# Patient Record
Sex: Male | Born: 1945 | Race: White | Hispanic: No | Smoking: Never smoker
Health system: Southern US, Community
[De-identification: ages and names within clinical notes are randomized; demographics above are authoritative.]

---

## 2017-11-23 ENCOUNTER — Emergency Department (HOSPITAL_COMMUNITY): Payer: Medicare Other

## 2017-11-23 ENCOUNTER — Encounter (HOSPITAL_COMMUNITY): Payer: Self-pay | Admitting: Emergency Medicine

## 2017-11-23 ENCOUNTER — Emergency Department (HOSPITAL_COMMUNITY)
Admission: EM | Admit: 2017-11-23 | Discharge: 2017-11-23 | Disposition: A | Discharge status: Home / Self Care | Payer: Medicare Other | Attending: Emergency Medicine | Admitting: Emergency Medicine

## 2017-11-23 DIAGNOSIS — S299XXA Unspecified injury of thorax, initial encounter: Secondary | ICD-10-CM | POA: Insufficient documentation

## 2017-11-23 DIAGNOSIS — M25551 Pain in right hip: Secondary | ICD-10-CM | POA: Diagnosis not present

## 2017-11-23 DIAGNOSIS — S0990XA Unspecified injury of head, initial encounter: Secondary | ICD-10-CM | POA: Diagnosis present

## 2017-11-23 DIAGNOSIS — Y999 Unspecified external cause status: Secondary | ICD-10-CM | POA: Diagnosis not present

## 2017-11-23 DIAGNOSIS — W14XXXA Fall from tree, initial encounter: Secondary | ICD-10-CM | POA: Insufficient documentation

## 2017-11-23 DIAGNOSIS — W19XXXA Unspecified fall, initial encounter: Secondary | ICD-10-CM

## 2017-11-23 DIAGNOSIS — M25511 Pain in right shoulder: Secondary | ICD-10-CM | POA: Insufficient documentation

## 2017-11-23 DIAGNOSIS — Y939 Activity, unspecified: Secondary | ICD-10-CM | POA: Insufficient documentation

## 2017-11-23 DIAGNOSIS — Y929 Unspecified place or not applicable: Secondary | ICD-10-CM | POA: Diagnosis not present

## 2017-11-23 DIAGNOSIS — T1490XA Injury, unspecified, initial encounter: Secondary | ICD-10-CM

## 2017-11-23 LAB — BASIC METABOLIC PANEL
ANION GAP: 4 — AB (ref 5–15)
BUN: 15 mg/dL (ref 8–23)
CALCIUM: 9.4 mg/dL (ref 8.9–10.3)
CHLORIDE: 110 mmol/L (ref 98–111)
CO2: 26 mmol/L (ref 22–32)
Creatinine, Ser: 1.49 mg/dL — ABNORMAL HIGH (ref 0.61–1.24)
GFR calc non Af Amer: 45 mL/min — ABNORMAL LOW (ref >60)
GFR, EST AFRICAN AMERICAN: 52 mL/min — AB (ref >60)
Glucose, Bld: 106 mg/dL — ABNORMAL HIGH (ref 70–99)
Potassium: 3.9 mmol/L (ref 3.5–5.1)
SODIUM: 140 mmol/L (ref 135–145)

## 2017-11-23 LAB — I-STAT CHEM 8, ED
BUN: 18 mg/dL (ref 8–23)
Calcium, Ion: 1.22 mmol/L (ref 1.15–1.40)
Chloride: 106 mmol/L (ref 98–111)
Creatinine, Ser: 1.6 mg/dL — ABNORMAL HIGH (ref 0.61–1.24)
GLUCOSE: 102 mg/dL — AB (ref 70–99)
HCT: 39 % (ref 39.0–52.0)
HEMOGLOBIN: 13.3 g/dL (ref 13.0–17.0)
POTASSIUM: 4.1 mmol/L (ref 3.5–5.1)
SODIUM: 141 mmol/L (ref 135–145)
TCO2: 26 mmol/L (ref 22–32)

## 2017-11-23 LAB — CBC
HCT: 39.6 % (ref 39.0–52.0)
HEMOGLOBIN: 12.9 g/dL — AB (ref 13.0–17.0)
MCH: 29.2 pg (ref 26.0–34.0)
MCHC: 32.6 g/dL (ref 30.0–36.0)
MCV: 89.6 fL (ref 80.0–100.0)
NRBC: 0 % (ref 0.0–0.2)
Platelets: 310 10*3/uL (ref 150–400)
RBC: 4.42 MIL/uL (ref 4.22–5.81)
RDW: 12.3 % (ref 11.5–15.5)
WBC: 10.6 10*3/uL — AB (ref 4.0–10.5)

## 2017-11-23 LAB — URINALYSIS, ROUTINE W REFLEX MICROSCOPIC
Bilirubin Urine: NEGATIVE
Glucose, UA: NEGATIVE mg/dL
Hgb urine dipstick: NEGATIVE
KETONES UR: 5 mg/dL — AB
Leukocytes, UA: NEGATIVE
NITRITE: NEGATIVE
Protein, ur: NEGATIVE mg/dL
Specific Gravity, Urine: 1.031 — ABNORMAL HIGH (ref 1.005–1.030)
pH: 6 (ref 5.0–8.0)

## 2017-11-23 MED ORDER — SODIUM CHLORIDE 0.9 % IV BOLUS
1000.0000 mL | Freq: Once | INTRAVENOUS | Status: AC
  Administered 2017-11-23: 1000 mL via INTRAVENOUS

## 2017-11-23 MED ORDER — IOHEXOL 300 MG/ML  SOLN
80.0000 mL | Freq: Once | INTRAMUSCULAR | Status: AC | PRN
  Administered 2017-11-23: 80 mL via INTRAVENOUS

## 2017-11-23 MED ORDER — HYDROCODONE-ACETAMINOPHEN 5-325 MG PO TABS: 1.0000 | ORAL_TABLET | Freq: Four times a day (QID) | ORAL | 0 refills | Status: AC | PRN

## 2017-11-23 MED ORDER — CAMPHOR-MENTHOL-METHYL SAL 1.2-5.7-6.3 % EX PTCH: 1.0000 | MEDICATED_PATCH | Freq: Every day | CUTANEOUS | 0 refills | Status: AC

## 2017-11-23 MED ORDER — LIDOCAINE 5 % EX PTCH
1.0000 | MEDICATED_PATCH | CUTANEOUS | Status: DC
  Administered 2017-11-23: 1 via TRANSDERMAL
  Filled 2017-11-23: qty 1

## 2017-11-23 NOTE — ED Triage Notes (Signed)
Pt to ER for evaluation of right hip pain and shoulder pain after falling out of his deer stand last night. Pt reports pain with walking. No midline cervical or spinal tenderness.

## 2017-11-23 NOTE — ED Notes (Signed)
Patient transported to CT 

## 2017-11-23 NOTE — ED Provider Notes (Signed)
[facility] EMERGENCY DEPARTMENT Provider Note   CSN: [id number] Arrival date & time: 11/23/17  1143     History   Chief Complaint Chief Complaint  Patient presents with  . Hip Pain  . Shoulder Pain    HPI Robert Flowers is a 72 y.o. male.  HPI  Patient is a 72 year old male with a history of hypertension and on antiplatelet therapy of 81 mg of aspirin daily presenting for fall.  Patient reports the fall occurred approximately 7 PM yesterday evening.  Patient reports he fell approximately 15 feet from a deer hunting scaffold onto his lower back and right hip.  He reports that he did hit his head in this process.  Patient denies loss of consciousness, but does report getting "the wind knocked out of him".  Patient reports he has had progressive difficulty with ambulation on the right hip, particularly in the posterior aspect.  He also reports right shoulder pain but is able to perform range of motion with this.  Patient denies abdominal tenderness, bruising, hematuria.  Patient denies visual disturbance, weakness or numbness in lower extremities, vomiting or retrograde amnesia.  He reports he was able to ambulate with antalgic gait to his car and drive home after the incident.  History reviewed. No pertinent past medical history.  There are no active problems to display for this patient.   History reviewed. No pertinent surgical history.      Home Medications    Prior to Admission medications   Not on File    Family History No family history on file.  Social History Social History   Tobacco Use  . Smoking status: Never Smoker  . Smokeless tobacco: Never Used  Substance Use Topics  . Alcohol use: Yes  . Drug use: Not on file     Allergies   Patient has no allergy information on record.   Review of Systems Review of Systems  Constitutional: Negative for chills and fever.  HENT: Negative for congestion and sore throat.   Eyes: Negative for  visual disturbance.  Respiratory: Negative for cough, chest tightness and shortness of breath.   Cardiovascular: Negative for chest pain and leg swelling.  Gastrointestinal: Negative for abdominal pain, nausea and vomiting.  Genitourinary: Negative for dysuria and flank pain.  Musculoskeletal: Positive for arthralgias, back pain and gait problem. Negative for joint swelling and myalgias.  Skin: Negative for rash.  Neurological: Negative for dizziness, syncope, light-headedness and headaches.     Physical Exam Updated Vital Signs BP (!) 158/90 (BP Location: Right Arm)   Pulse 82   Temp 98.9 F (37.2 C) (Oral)   Resp 16   SpO2 98%   Physical Exam  Constitutional: He appears well-developed and well-nourished. No distress.  HENT:  Head: Normocephalic and atraumatic.  Mouth/Throat: Oropharynx is clear and moist.  Eyes: Pupils are equal, round, and reactive to light. Conjunctivae and EOM are normal.  Neck: Normal range of motion. Neck supple.  Cardiovascular: Normal rate, regular rhythm, S1 normal and S2 normal.  No murmur heard. Pulmonary/Chest: Effort normal and breath sounds normal. He has no wheezes. He has no rales.  Patient has right lateral chest wall tenderness in the right lower ribs.  Abdominal: Soft. He exhibits no distension. There is tenderness. There is no guarding.  Right-sided suprapubic tenderness to palpation.  No guarding or rebound. No abdominal or flank ecchymosis noted.  Musculoskeletal:  Patient has no midline cervical spine tenderness, but has right-sided paraspinal muscular tenderness palpation. No  midline or paraspinal muscular tenderness of thoracic or lumbar spine. Right shoulder with tenderness of scapular region. Full ROM. Negative empty can test, negative Neer's. No swelling, erythema or ecchymosis present. No step-off, crepitus, or deformity appreciated. 5/5 muscle strength of UE. 2+ radial pulse, sensation intact and all compartments soft.  Patient  has exquisite tenderness to palpation in the right hamstring.  No balled up tissue noted.  Patient still has intact hip flexor and extensor mechanism of the right lower extremity.  Neurological: He is alert.  Cranial nerves grossly intact. Patient moves extremities symmetrically and with good coordination.  Skin: Skin is warm and dry. No rash noted. No erythema.  Psychiatric: He has a normal mood and affect. His behavior is normal. Judgment and thought content normal.  Nursing note and vitals reviewed.    ED Treatments / Results  Labs (all labs ordered are listed, but only abnormal results are displayed) Labs Reviewed  CBC - Abnormal; Notable for the following components:      Result Value   WBC 10.6 (*)    Hemoglobin 12.9 (*)    All other components within normal limits  BASIC METABOLIC PANEL - Abnormal; Notable for the following components:   Glucose, Bld 106 (*)    Creatinine, Ser 1.49 (*)    GFR calc non Af Amer 45 (*)    GFR calc Af Amer 52 (*)    Anion gap 4 (*)    All other components within normal limits  I-STAT CHEM 8, ED - Abnormal; Notable for the following components:   Creatinine, Ser 1.60 (*)    Glucose, Bld 102 (*)    All other components within normal limits    EKG None  Radiology Dg Shoulder Right  Result Date: 11/23/2017 CLINICAL DATA:  Right shoulder pain since a fall out of a deer stand today. Initial encounter. EXAM: RIGHT SHOULDER - 2+ VIEW COMPARISON:  None. FINDINGS: The humerus is located. No acute fracture. Remote right rib fractures are identified. Small well corticated bone fragments about the acromioclavicular joint and partial ossification of the coracoclavicular ligament are consistent with remote injury. IMPRESSION: No acute abnormality. Remote right rib fractures. Findings compatible with remote [facility] joint injury. Electronically Signed   By: Drusilla Kanner M.D.   On: 11/23/2017 13:14   Ct Head Wo Contrast  Result Date: 11/23/2017 CLINICAL  DATA:  Patient fell approximately 15 feet backwards. EXAM: CT HEAD WITHOUT CONTRAST CT CERVICAL SPINE WITHOUT CONTRAST TECHNIQUE: Multidetector CT imaging of the head and cervical spine was performed following the standard protocol without intravenous contrast. Multiplanar CT image reconstructions of the cervical spine were also generated. COMPARISON:  None. FINDINGS: CT HEAD FINDINGS Brain: No evidence of acute infarction, hemorrhage, hydrocephalus, extra-axial collection or mass lesion/mass effect. Mild brain parenchymal volume loss and deep white matter microangiopathy. Vascular: Calcific atherosclerotic disease of the intra cavernous carotid arteries. Skull: Normal. Negative for fracture or focal lesion. Sinuses/Orbits: Polypoid mucosal thickening of bilateral frontal, bilateral ethmoid, bilateral maxillary and bilateral sphenoid sinuses. Concave deformity of the anterior maxillary sinus walls, and periosteal thickening, suggestive of longstanding chronic sinusitis. Prior surgical resection of the medial maxillary walls. Other: None. CT CERVICAL SPINE FINDINGS Alignment: Straightening of cervical lordosis. Minimal posterior listhesis of C3 on C4, likely degenerative. Skull base and vertebrae: No acute fracture. No primary bone lesion or focal pathologic process. Soft tissues and spinal canal: No prevertebral fluid or swelling. No visible canal hematoma. Disc levels: Osteoarthritic changes at C3-C4, C5-C6 and C6-C7 mild  multilevel posterior facet arthropathy. Upper chest: Negative. Other: Chronic appearing upper rib deformities, likely due to prior traumatic injury. IMPRESSION: No acute intracranial abnormality. Mild brain parenchymal atrophy. No evidence of acute traumatic injury to the cervical spine. 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 Electronically Signed   By: Ted Mcalpine M.D.   On: 11/23/2017 16:40   Ct Chest W Contrast  Result Date:  11/23/2017 CLINICAL DATA:  Chest trauma, blunt, with right hip pain. EXAM: CT CHEST, ABDOMEN, AND PELVIS WITH CONTRAST TECHNIQUE: Multidetector CT imaging of the chest, abdomen and pelvis was performed following the standard protocol during bolus administration of intravenous contrast. CONTRAST:  80mL OMNIPAQUE IOHEXOL 300 MG/ML  SOLN COMPARISON:  None. FINDINGS: CT CHEST FINDINGS Cardiovascular: Normal heart size. No pericardial effusion. Diffuse atherosclerotic calcification of the aorta. Coronary calcification as well. Mediastinum/Nodes: Negative for hematoma or pneumomediastinum. Small fatty hiatal hernia. Lungs/Pleura: No evidence of hemothorax, pneumothorax, or lung contusion. There is centrilobular emphysema. Musculoskeletal: See below CT ABDOMEN PELVIS FINDINGS Hepatobiliary: No evidence of injury.  Cholelithiasis. Pancreas: Fatty atrophy.  No acute finding Spleen: Negative Adrenals/Urinary Tract: No adrenal hemorrhage or renal injury identified. Stranding anterior to the bladder as discussed below. Bladder wall is not thickened and there is no visible luminal clot. Right lower pole cortical scarring. There is a punctate left renal calculus. Stomach/Bowel: No evidence of bowel or mesenteric injury. Left colonic diverticulosis. Vascular/Lymphatic: Diffuse atherosclerotic calcification of the aorta. No acute vascular finding. No retroperitoneal hematoma Reproductive: Negative. Other: No ascites or pneumoperitoneum Musculoskeletal: Prior trauma or bone harvest to the left iliac wing. Multiple remote right-sided rib fractures that are healed. There was likely also costovertebral injury at that time as there is asymmetric right-sided spurring and ankylosis. There is stranding anterior to the bladder that tracks along the bilateral inferior rectus abdominus and likely anterior to the symphysis pubis. No symphysis pubis diastasis or pubic body fracture is seen. There is a small bilateral fatty direct inguinal  hernias, age-indeterminate. IMPRESSION: 1. Soft tissue stranding anterior to the bladder and above the symphysis pubis, favor musculoskeletal injury as the bladder wall is not thickened. Please correlate with urinalysis. Small bilateral direct inguinal hernia of uncertain chronicity. 2. No traumatic finding otherwise. 3. Aortic Atherosclerosis (ICD10-I70.0) and Emphysema (ICD10-J43.9). 4. Cholelithiasis. Electronically Signed   By: Marnee Spring M.D.   On: 11/23/2017 16:45   Ct Cervical Spine Wo Contrast  Result Date: 11/23/2017 CLINICAL DATA:  Patient fell approximately 15 feet backwards. EXAM: CT HEAD WITHOUT CONTRAST CT CERVICAL SPINE WITHOUT CONTRAST TECHNIQUE: Multidetector CT imaging of the head and cervical spine was performed following the standard protocol without intravenous contrast. Multiplanar CT image reconstructions of the cervical spine were also generated. COMPARISON:  None. FINDINGS: CT HEAD FINDINGS Brain: No evidence of acute infarction, hemorrhage, hydrocephalus, extra-axial collection or mass lesion/mass effect. Mild brain parenchymal volume loss and deep white matter microangiopathy. Vascular: Calcific atherosclerotic disease of the intra cavernous carotid arteries. Skull: Normal. Negative for fracture or focal lesion. Sinuses/Orbits: Polypoid mucosal thickening of bilateral frontal, bilateral ethmoid, bilateral maxillary and bilateral sphenoid sinuses. Concave deformity of the anterior maxillary sinus walls, and periosteal thickening, suggestive of longstanding chronic sinusitis. Prior surgical resection of the medial maxillary walls. Other: None. CT CERVICAL SPINE FINDINGS Alignment: Straightening of cervical lordosis. Minimal posterior listhesis of C3 on C4, likely degenerative. Skull base and vertebrae: No acute fracture. No primary bone lesion or focal pathologic process. Soft tissues and spinal canal: No prevertebral fluid or  swelling. No visible canal hematoma. Disc levels:  Osteoarthritic changes at C3-C4, C5-C6 and C6-C7 mild multilevel posterior facet arthropathy. Upper chest: Negative. Other: Chronic appearing upper rib deformities, likely due to prior traumatic injury. IMPRESSION: No acute intracranial abnormality. Mild brain parenchymal atrophy. No evidence of acute traumatic injury to the cervical spine. 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 Electronically Signed   By: Dobrinka  Dimitrova M.D.   On: 11/23/2017 16:40   Ct Abdomen Pelvis W Contrast  Result Date: 11/23/2017 CLINICAL DATA:  Chest trauma, blunt, with right hip pain. EXAM: CT CHEST, ABDOMEN, AND PELVIS WITH CONTRAST TECHNIQUE: Multidetector CT imaging of the chest, abdomen and pelvis was performed following the standard protocol during bolus administration of intravenous contrast. CONTRAST:  [id number] EdHeartland Regional 66KorKoreaePrKentuckyMaisie [place] MedicAscension Borgess Ho78mTriaTimo7761 LafFrederick S64KorKoreaePrKentuckyMaisie St Alexius MedicMedical Center Of Auror67mTriaTimo32 SprJohn C Fremont Healt74KorKoreaePrKentuckyMaisie Vanderbilt Wilson CountyUsmd Hospital At Fort33mTriaTimo9962 Chesapeake Surgica14KorKoreaePrKentuckyMaisie Cha EverettCentennial Peaks Ho80mTriaTimo679 WestmiAd Hos81KorKoreaePrKentuckyMaisie Louisiana Extended Care Hospital Of WeGastroenterology Associat98mTriaTimo821Greater El Monte Comm22KorKoreaePrKentuckyMaisie Bayfront Ambulatory Surgical [facility] O37mTriaTimo93 Peg SKings Daughters 12KorKoreaePrKentuckyMaisie Texas Health Womens Specialty SurgeBrandon Ambulatory Surgery Center Lc Dba Brandon Ambulatory Surgery 40mTriaTimo16 WaMackinac Straits Hospital And53KorKoreaePrKentuckyMaisie [facility] [id number] ShiNorth Oak Regional 75KorKoreaePrKentuckyMaisie [facility] [id number] Vidant B31KorKoreaePrKentuckyMaisie White Fence SurgicOklahoma State University Medical [id number] GleGlendora Comm8KorKoreaePrKentuckyMaisie Baylor Scott & White Medical CenterHca [facility] Medical 51mTriaTimo188 North Space Coast 71KorKoreaePrKentuckyMaisie South [facility] Endoscopy Pacific Endoscopy [id number] Allegheny Ge40KorKoreaePrKentuckyMaisie Barnet Dulaney Perkins Eye Center Safford SurgeOdessa Regional Medical Center South 47mTriaTimo8086 ASonora Behavioral Health Hospi29KorKoreaePrKentuckyMaisie Encompass Health Rehabilitation HospitalUt Health East Texas Hen61mTriaTimo89 UnivGreenville Community47KorKoreaePrKentuckyMaisie Main Line Surgery St Joseph Ho42mTriaTimo251 BowBeckle11KorKoreaePrKentuckyMaisie Surgcenter OfFoothills Surgery Cent11mTriad H449 W. NewKorea3PrKentuckyMaisie Stony Point Surger23mTriaTimo992 [place] 24KorKoreaePrKentuckyMaisie H[facility] Regional MedicThe Plastic Surgery Center La38mTriaTimo7594Co4KorKoreaePrKentuckyMaisie Bhs Ambulatory Surgery Center At BaUs Air Forc96mTriaTimo2 [facility] Dba South Cen87KorKoreaePrKentuckyMaisie St. ElizabethFort Washington Surgery Cent52mTriaTimo454A Roy A Himelfarb 15KorKoreaePrKentuckyMaisie [facility] [place] Ambulatory Surgery [facility] Surgicenter LLCDory Perusion/film/videoL  SOLN COMPARISON:  None. FINDINGS: CT CHEST FINDINGS Cardiovascular: Normal heart size. No pericardial effusion. Diffuse atherosclerotic calcification of the aorta. Coronary calcification as well. Mediastinum/Nodes: Negative for hematoma or pneumomediastinum. Small fatty hiatal hernia. Lungs/Pleura: No evidence of hemothorax, pneumothorax, or lung contusion. There is centrilobular emphysema. Musculoskeletal: See below CT ABDOMEN PELVIS FINDINGS Hepatobiliary: No evidence of injury.  Cholelithiasis. Pancreas: Fatty atrophy.  No acute finding Spleen: Negative Adrenals/Urinary Tract: No adrenal hemorrhage or renal injury identified. Stranding anterior to the bladder as discussed below. Bladder wall is not thickened and there is no visible luminal clot. Right lower pole cortical scarring. There is a punctate left renal calculus. Stomach/Bowel: No evidence of bowel or mesenteric injury. Left colonic diverticulosis. Vascular/Lymphatic: Diffuse atherosclerotic calcification of the aorta. No acute vascular finding. No retroperitoneal hematoma  Reproductive: Negative. Other: No ascites or pneumoperitoneum Musculoskeletal: Prior trauma or bone harvest to the left iliac wing. Multiple remote right-sided rib fractures that are healed. There was likely also costovertebral injury at that time as there is asymmetric right-sided spurring and ankylosis. There is stranding anterior to the bladder that tracks along the bilateral inferior rectus abdominus and likely anterior to the symphysis pubis. No symphysis pubis diastasis or pubic body fracture is seen. There is a small bilateral fatty direct inguinal hernias, age-indeterminate. IMPRESSION: 1. Soft tissue stranding anterior to the bladder and above the symphysis pubis, favor musculoskeletal injury as the bladder wall is not thickened. Please correlate with urinalysis. Small bilateral direct inguinal hernia of uncertain chronicity. 2. No traumatic finding otherwise. 3. Aortic Atherosclerosis (ICD10-I70.0) and Emphysema (ICD10-J43.9). 4. Cholelithiasis. Electronically Signed   By: Jonathon  Watts M.D.   On: 11/23/2017 16:45   Ct No Charge  Result Date: 11/23/2017 CLINICAL DATA:  Right hip pain since a fall out of a deer stand last night. Initial encounter. EXAM: CT ADDITIONAL VIEWS AT NO CHARGE TECHNIQUE: Dedicated images of the right hip were obtained by reformatting images from the patient's CT chest, abdomen and pelvis this same day. COMPARISON:  None. FINDINGS: The right hip is located. There is no fracture. Mild chondrocalcinosis is noted about the hip. Small osteophytes are present about the right femoral head. Joint space is preserved. No joint effusion. Imaged musculature intact. Degenerative disc disease L5-S1 is noted. For findings regarding the imaged intrapelvic contents, please see report of dedicated CT chest, abdomen and pelvis. IMPRESSION: No acute abnormality right hip. Mild osteoarthritis.  Chondrocalcinosis of the right hip also noted. Electronically Signed   By: Thomas  Dalessio M.D.   On:  11/23/2017 17:07   Dg Hip Unilat  With Pelvis 2-3 Views Right  Result Date: 11/23/2017 CLINICAL DATA:  Right hip pain after fall 20 feet from  deer stand last night. EXAM: DG HIP (WITH OR WITHOUT PELVIS) 2-3V RIGHT COMPARISON:  None. 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 No definite acute fracture or dislocation. Minimal degenerate change of the spine and sacroiliac joints. IMPRESSION: No acute findings. Electronically Signed   By: Elberta Fortis M.D.   On: 11/23/2017 13:16    Procedures Procedures (including critical care time)  Medications Ordered in ED Medications  lidocaine (LIDODERM) 5 % 1 patch (1 patch Transdermal Patch Applied 11/23/17 1438)  sodium chloride 0.9 % bolus 1,000 mL (has no administration in time range)  iohexol (OMNIPAQUE) 300 MG/ML solution 80 mL (80 mLs Intravenous Contrast Given 11/23/17 1541)     Initial Impression / Assessment and Plan / ED Course  I have reviewed the triage vital signs and the nursing notes.  Pertinent labs & imaging results that were available during my care of the patient were reviewed by me and considered in my medical decision making (see chart for details).  Clinical Course as of Nov 24 1723  Sat Nov 23, 2017  1620 Case discussed with Aviva Kluver, PA-C. Waiting on CT scan results. If no acute fractures or findings, then discharge with ortho follow-up. Patient has denied narcotics for pain control and only request topicals.   [GM]  1712 CT head shows no acute findings such as skull fracture, hemorrhage or signs of acute ischemia/infarct. CT c-spine shows no fractures or spondylosis. CT abdomen/pelvis shows no internal injuries, pelvic fractures or other acute injuries. Stranding around bladder seen most likely from MSK injuries from the accident. UA ordered by previous provider Aviva Kluver, PA-C; although patient has no urinary complaints.  Initial x-rays of hips and right shoulder are normal.   [GM]      Clinical Course User Index [GM] Mortis, Sharyon Medicus, PA-C    Patient is nontoxic-appearing, hemodynamically stable, and in no acute distress.  Patient had a high mechanism of injury and is anticoagulated on 81 mg of aspirin daily.  Due to patient's multiple areas of tenderness, scans obtained of head, cervical spine, chest, abdomen, pelvis, and right hip.  Scans are demonstrating stranding around the bladder, less likely to be urinary in origin, and more likely trauma related.  No acute bony injuries.  Will refer patient to Ortho given the significant tenderness of the hamstring and right shoulder.   Care signed out to [facility], PA-C at 5:28 PM to await urine.  Patient agreeing to use walker at home.  Patient prescribed Norco, #10. I have reviewed the patient's information in the [place] Controlled Substance Database for the past 12 months and found them to have no Rx on record.  Opiates were prescribed for an acute, painful condition. The patient was given information on side effects and encouraged to use other, non-opiate pain medication primary, only using opiate medicine sparingly for severe pain.  This is a supervised visit with Dr. Virgina Norfolk. Evaluation, management, and discharge planning discussed with this attending physician.  Final Clinical Impressions(s) / ED Diagnoses   Final diagnoses:  Right hip pain  Fall, initial encounter  Acute pain of right shoulder    ED Discharge Orders         Ordered    HYDROcodone-acetaminophen (NORCO/VICODIN) 5-325 MG tablet  Every 6 hours PRN     11/23/17 1732           Elisha Ponder, PA-C 11/23/17 1734    Virgina Norfolk, DO 11/23/17 1926

## 2017-11-23 NOTE — ED Notes (Signed)
No E-signature available; pt is stable for discharge and states understanding of discharge instructions  

## 2017-11-23 NOTE — ED Notes (Signed)
Pt is not on anticoagulation.  

## 2017-11-23 NOTE — ED Provider Notes (Signed)
  Physical Exam  BP (!) 154/74 (BP Location: Right Arm)   Pulse 74   Temp 98.9 F (37.2 C) (Oral)   Resp 18   SpO2 100%   Physical Exam Initial exam conducted by previous provider Aviva Kluver, PA-C ED Course/Procedures   Clinical Course as of Nov 23 1744  Sat Nov 23, 2017  1620 Case discussed with Aviva Kluver, PA-C. Waiting on CT scan results. If no acute fractures or findings, then discharge with ortho follow-up. Patient has denied narcotics for pain control and only request topicals.   [GM]  1712 CT head shows no acute findings such as skull fracture, hemorrhage or signs of acute ischemia/infarct. CT c-spine shows no fractures or spondylosis. CT abdomen/pelvis shows no internal injuries, pelvic fractures or other acute injuries. Stranding around bladder seen most likely from MSK injuries from the accident. UA ordered by previous provider Aviva Kluver, PA-C; although patient has no urinary complaints.  Initial x-rays of hips and right shoulder are normal.   [GM]    Clinical Course User Index [GM] Cadyn Rodger, Sharyon Medicus, PA-C    Procedures  MDM  Robert Flowers is a 72 yo male who presented 1 day following a fall from 15 feet while hunting. He is on anticoagulation and his major complaints are right shoulder and hip pain. Multiple imaging studies were done to evaluate for injuries. Imaging results are as discussed above. He is stable for discharge with outpatient ortho follow-up. Patient requests information for [facility]. Doneen Poisson [facility]. Rx for pain prescribed by Aviva Kluver, PA-C.  Dispo: Home. After thorough clinical evaluation, this patient is determined to be medically stable and can be safely discharged with the previously mentioned treatment and/or outpatient follow-up/referral(s). At this time, there are no other apparent medical conditions that require further screening, evaluation or treatment.       [name], [place] 11/23/17  1750    [name], MD 11/24/17 [phone]

## 2017-11-23 NOTE — Discharge Instructions (Signed)
All of your imaging today came back normal. There are no fractures or dislocations anywhere seen. Given the fall, it is reasonable to believe that you have strained some muscles and ligaments in your hip and shoulder. You have been prescribed pain pills to take and some pain patches that you can place on your body as well.   I have listed the contact information for the orthopedist, Dr. Magnus Ivan, for you to call and follow-up with on Monday.  Thank you for allowing [place] to take care of you today. Be safe on your next hunting trip.

## 2017-11-24 LAB — URINE CULTURE: Culture: NO GROWTH

## 2019-12-21 IMAGING — CT CT CERVICAL SPINE W/O CM
5 of 8 series · 12 of 33 positions shown, 13 images · non-contrast
Comparison: None.

CLINICAL DATA: Patient fell approximately 15 feet backwards.

EXAM:
CT HEAD WITHOUT CONTRAST
CT CERVICAL SPINE WITHOUT CONTRAST
TECHNIQUE: Multidetector CT imaging of the head and cervical spine was
performed following the standard protocol without intravenous
contrast. Multiplanar CT image reconstructions of the cervical spine
were also generated.

[Series 6: head bone · axial · 0.44mm/px · z∈[-56,-4]mm · 2 of 79 slices shown]
[im 27/79  bone]
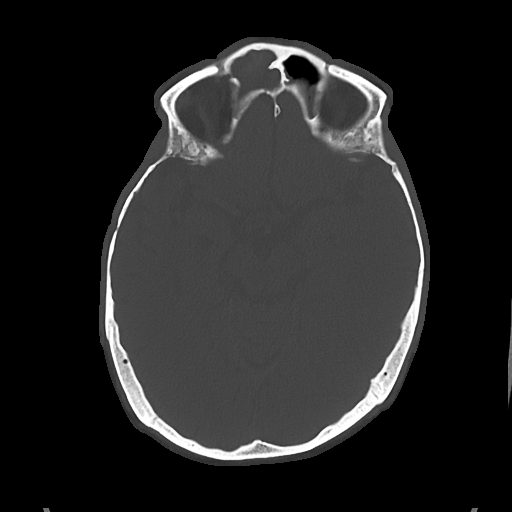
[im 53/79  bone]
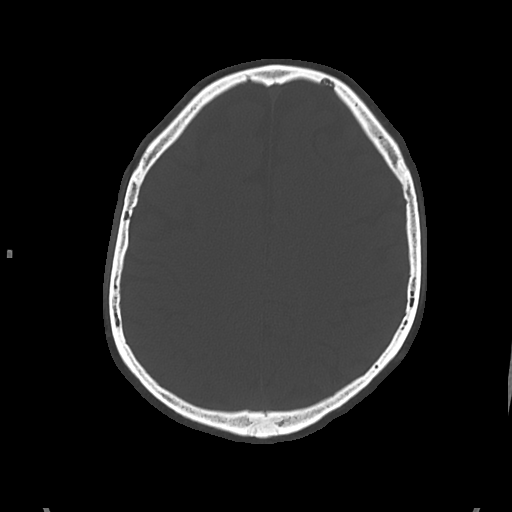

[Series 10: c spine soft · axial · 0.26mm/px · z∈[-217,-117]mm · 3 of 101 slices shown]
[im 26/101  soft-tissue]
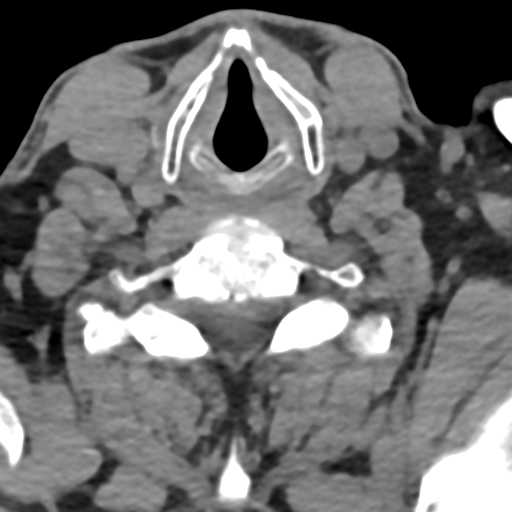
[im 51/101  soft-tissue]
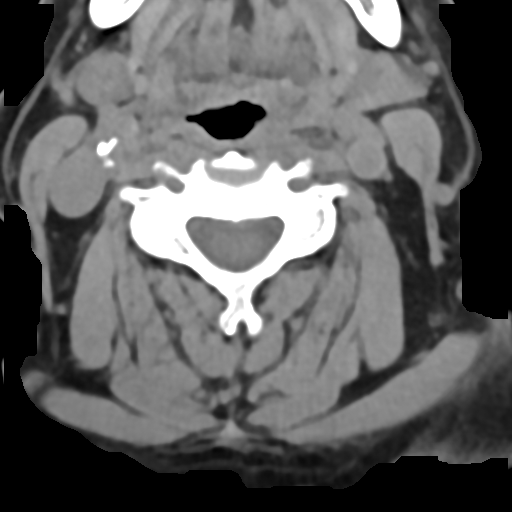
[im 76/101  soft-tissue]
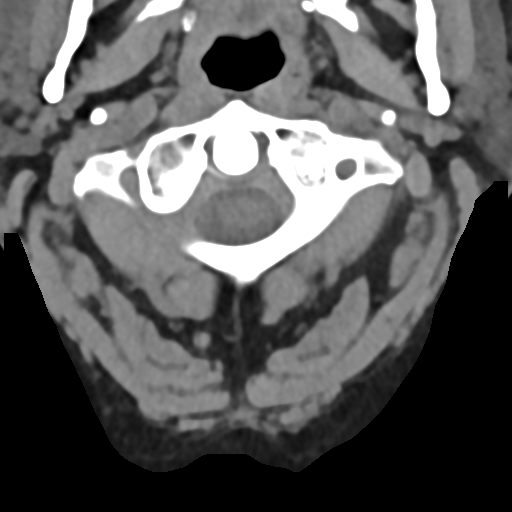

[Series 11: sag bone · sagittal · 0.30mm/px · 4 of 49 slices shown]
[im 10/49  bone]
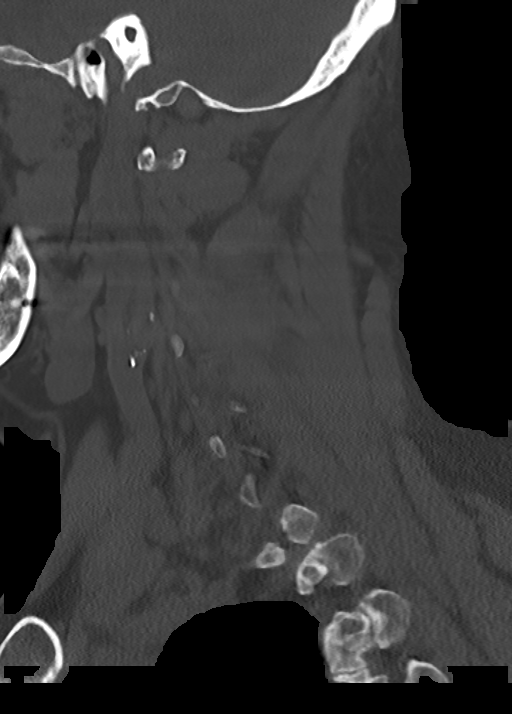
[im 20/49  bone]
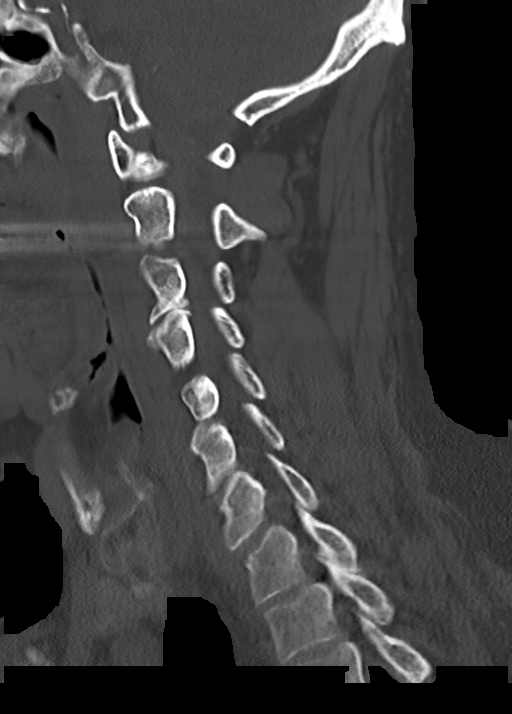
[im 29/49  bone]
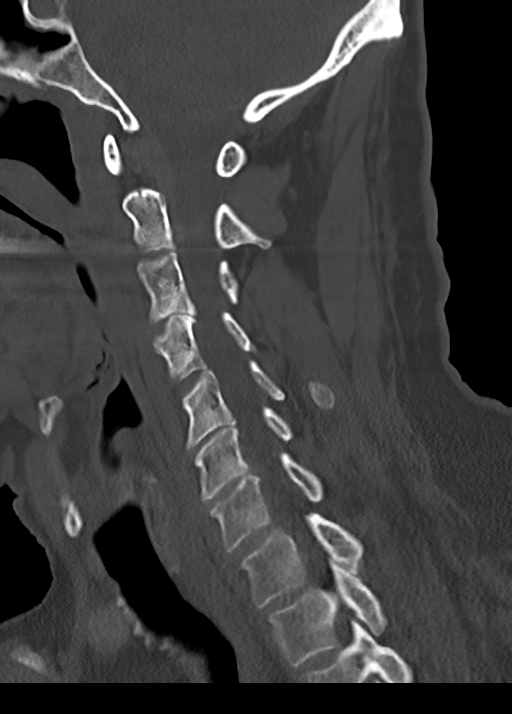
[im 39/49  bone]
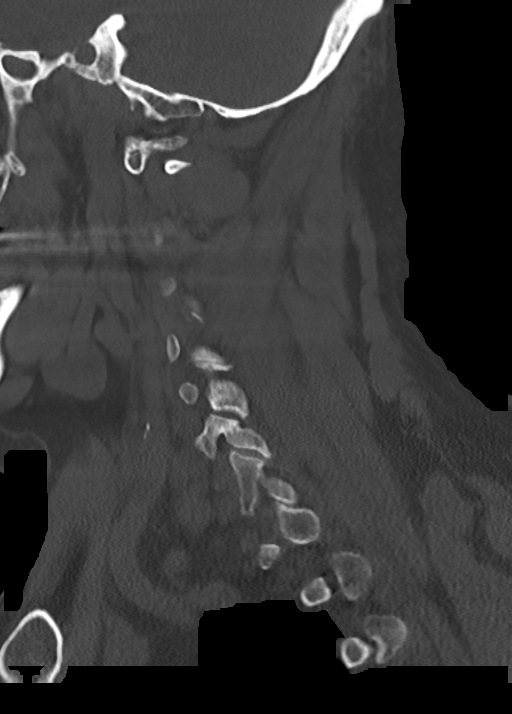

[Series 12: cor bone · coronal · 0.29mm/px · 1 of 46 slices shown]
[im 23/46  bone]
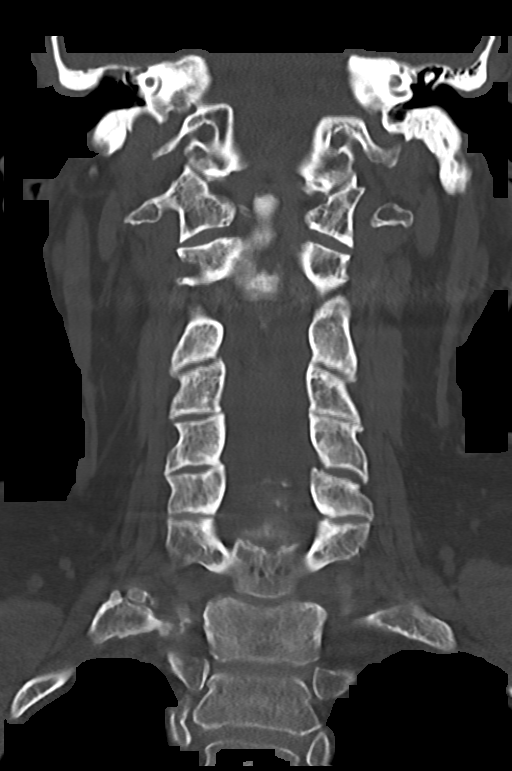

[Series 13: orthogonal axials · axial · 0.21mm/px · z∈[-230,-168]mm · 2 of 98 slices shown, 3 images]
[im 33/98  soft-tissue]
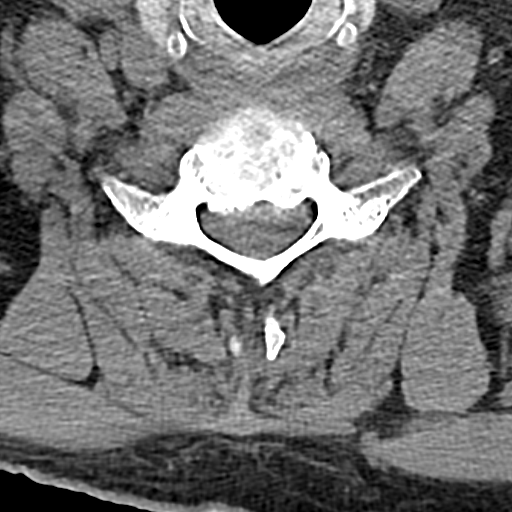
[im 33/98  bone]
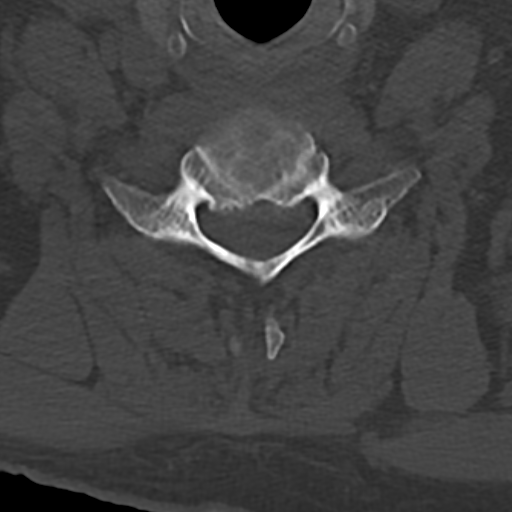
[im 65/98  bone]
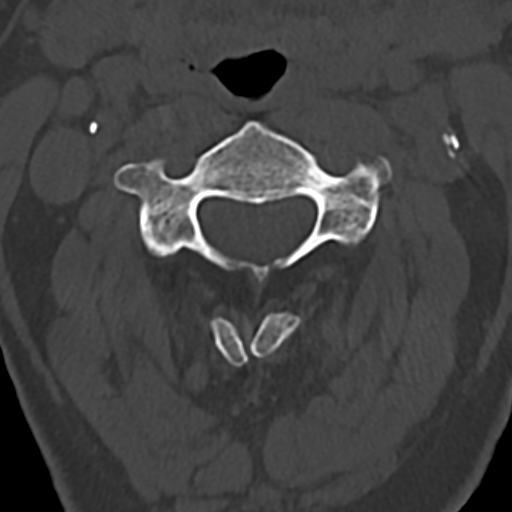

[12 of 33 positions shown; findings below may reference images not displayed]

FINDINGS: CT HEAD FINDINGS

Brain: No evidence of acute infarction, hemorrhage, hydrocephalus,
extra-axial collection or mass lesion/mass effect. Mild brain
parenchymal volume loss and deep white matter microangiopathy.

Vascular: Calcific atherosclerotic disease of the intra cavernous
carotid arteries.

Skull: Normal. Negative for fracture or focal lesion.

Sinuses/Orbits: Polypoid mucosal thickening of bilateral frontal,
bilateral ethmoid, bilateral maxillary and bilateral sphenoid
sinuses. Concave deformity of the anterior maxillary sinus walls,
and periosteal thickening, suggestive of longstanding chronic
sinusitis. Prior surgical resection of the medial maxillary walls.

Other: None.

CT CERVICAL SPINE FINDINGS

Alignment: Straightening of cervical lordosis. Minimal posterior
listhesis of C3 on C4, likely degenerative.

Skull base and vertebrae: No acute fracture. No primary bone lesion
or focal pathologic process.

Soft tissues and spinal canal: No prevertebral fluid or swelling. No
visible canal hematoma.

Disc levels: Osteoarthritic changes at C3-C4, C5-C6 and C6-C7 mild
multilevel posterior facet arthropathy.

Upper chest: Negative.

Other: Chronic appearing upper rib deformities, likely due to prior
traumatic injury.
IMPRESSION: No acute intracranial abnormality.

Mild brain parenchymal atrophy.

No evidence of acute traumatic injury to the cervical spine.

Multilevel osteoarthritic changes of the cervical spine.

Chronic appearing pansinusitis.

## 2019-12-21 IMAGING — CR DG HIP (WITH OR WITHOUT PELVIS) 2-3V*R*
3 series · 3 of 3 positions shown · non-contrast
Comparison: None.

CLINICAL DATA: Right hip pain after fall 20 feet from [facility]
[place] night.

EXAM:
DG HIP (WITH OR WITHOUT PELVIS) 2-3V RIGHT

[pelvis ap]
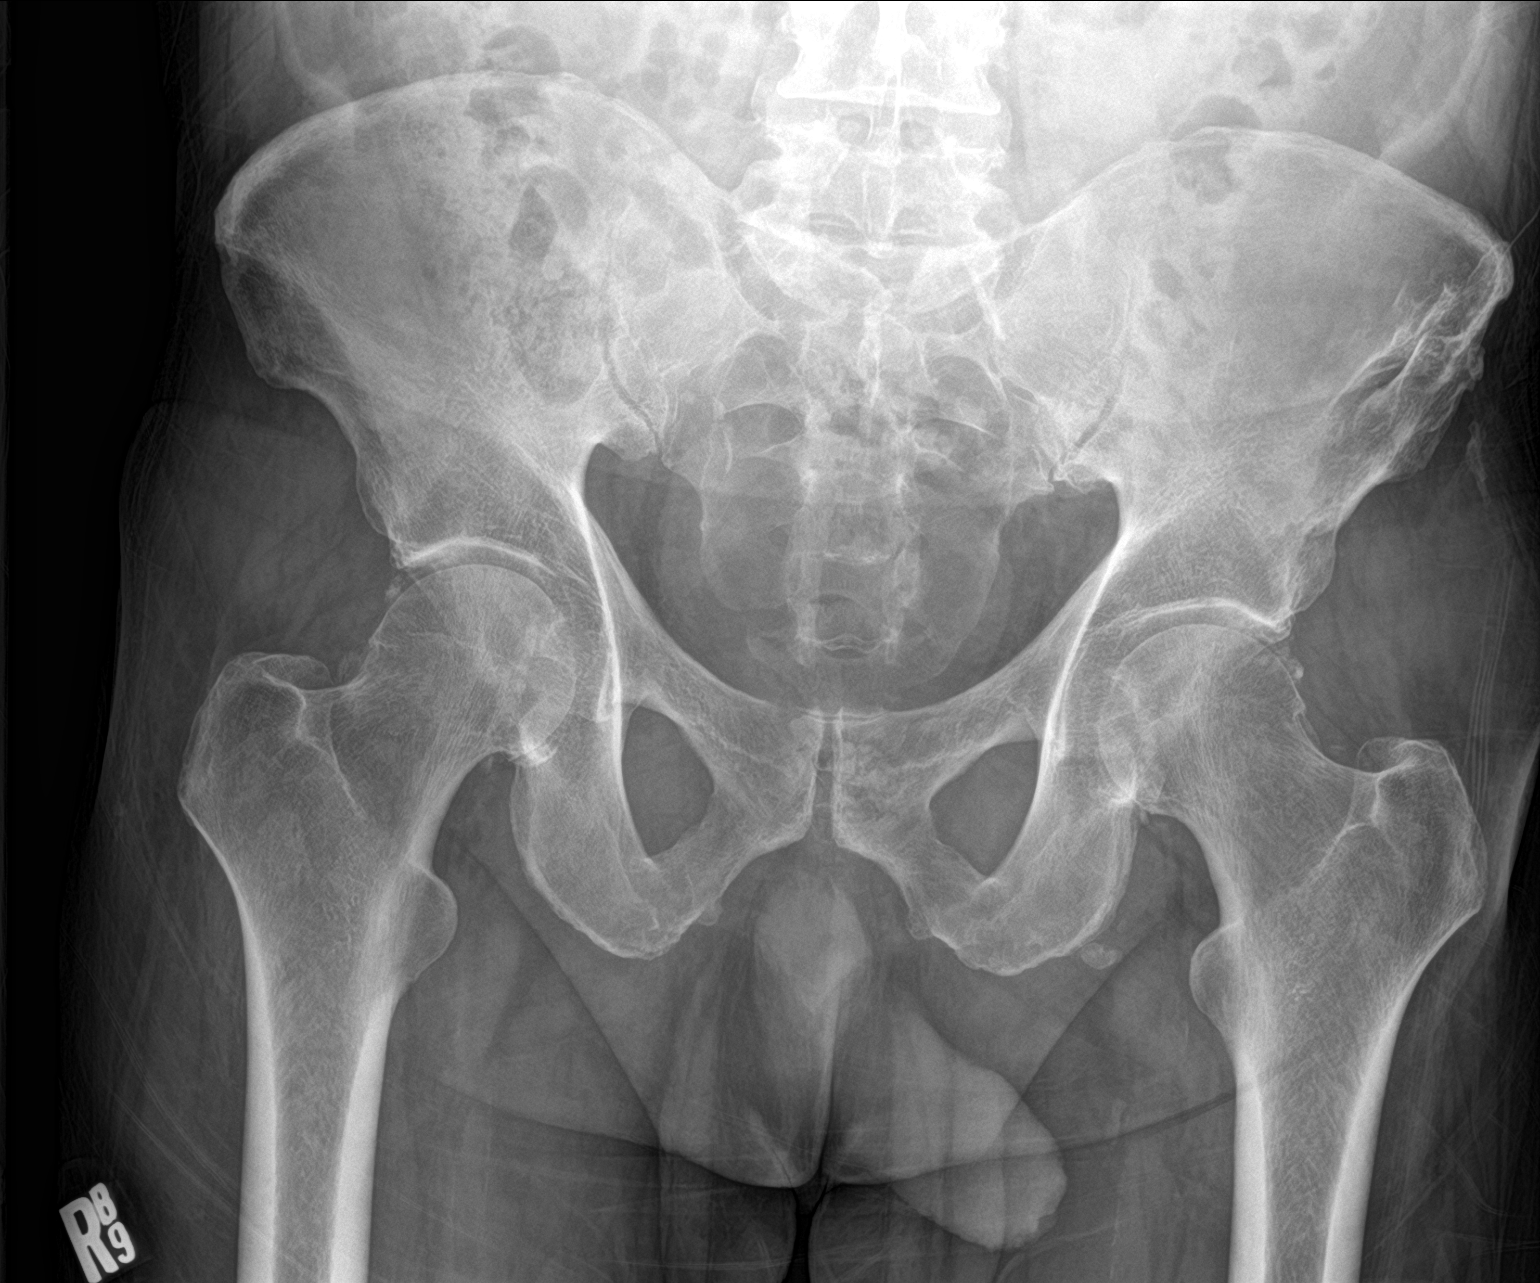

[hip ap]
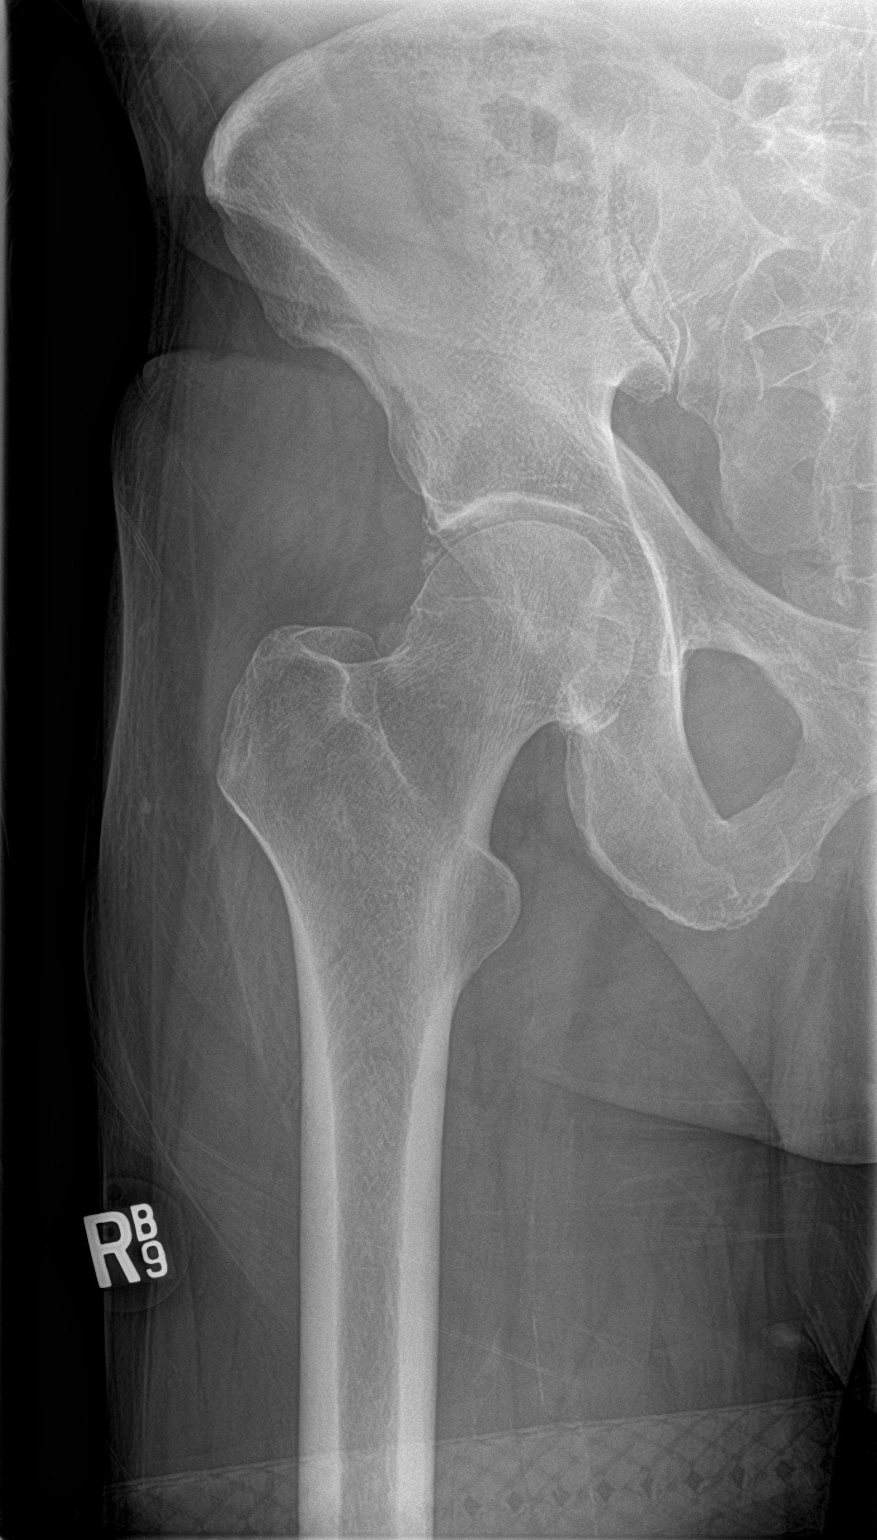

[hip lat]
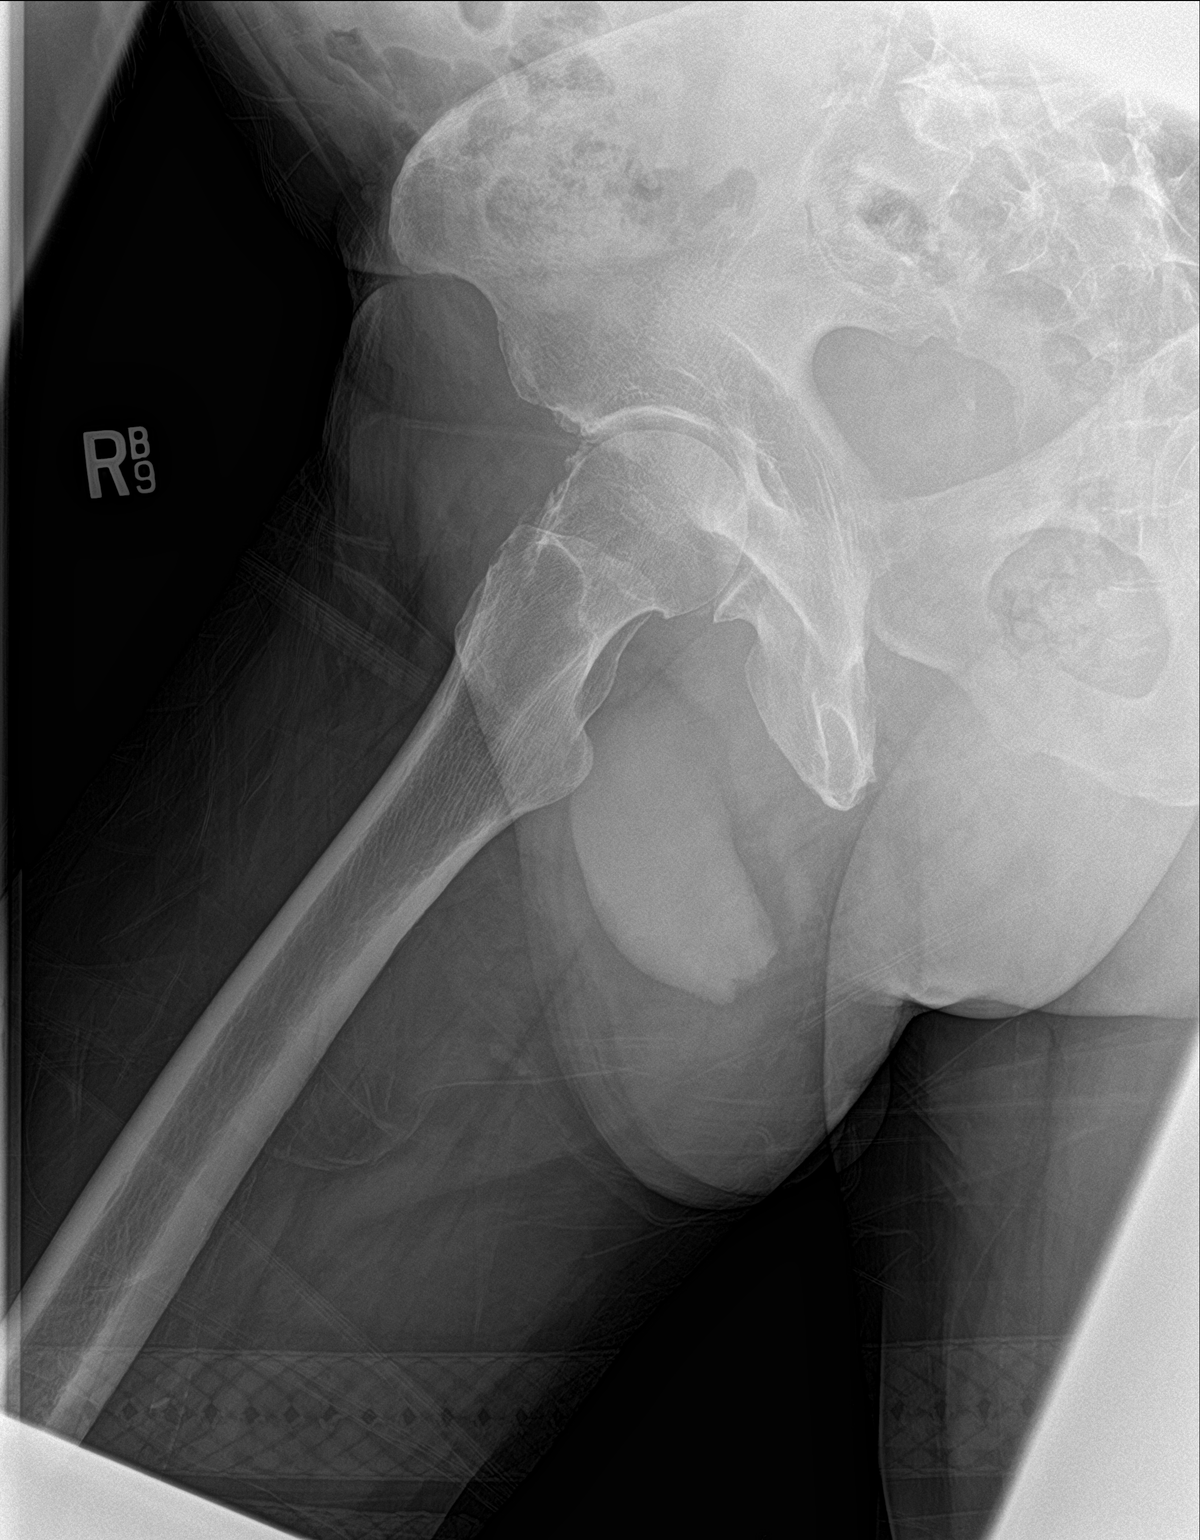

[3 of 3 positions shown; findings below may reference images not displayed]

FINDINGS: Mild symmetric osteoarthritic change of the hips. No definite acute
fracture or dislocation. Minimal degenerate change of the spine and
sacroiliac joints.
IMPRESSION: No acute findings.
